# Patient Record
Sex: Female | Born: 1970 | Race: White | Hispanic: No
Health system: Southern US, Community
[De-identification: ages and names within clinical notes are randomized; demographics above are authoritative.]

---

## 2019-06-18 ENCOUNTER — Ambulatory Visit: Payer: 59 | Attending: Internal Medicine

## 2019-06-18 DIAGNOSIS — Z23 Encounter for immunization: Secondary | ICD-10-CM

## 2019-06-18 NOTE — Progress Notes (Signed)
   Covid-19 Vaccination Clinic  Name:  Sarah Cummings    MRN: [id number] DOB: 1970/08/05  06/18/2019  [name] was observed post Covid-19 immunization for 15 minutes without incident. She was provided with Vaccine Information Sheet and instruction to access the V-Safe system.   [name] was instructed to call 911 with any severe reactions post vaccine: Marland Kitchen Difficulty breathing  . Swelling of face and throat  . A fast heartbeat  . A bad rash all over body  . Dizziness and weakness   Immunizations Administered    Name Date Dose VIS Date Route   Pfizer COVID-19 Vaccine 06/18/2019  4:29 PM 0.3 mL 03/18/2019 Intramuscular   Manufacturer: [place]   Lot: [id number]   [place]: [id number]

## 2019-07-12 ENCOUNTER — Ambulatory Visit: Payer: 59

## 2019-07-12 ENCOUNTER — Ambulatory Visit: Payer: 59 | Attending: Internal Medicine

## 2019-07-12 DIAGNOSIS — Z23 Encounter for immunization: Secondary | ICD-10-CM

## 2019-07-12 NOTE — Progress Notes (Signed)
   Covid-19 Vaccination Clinic  Name:  GERRE YOSHINO    MRN: [id number] DOB: 10-19-1970  07/12/2019  Ms. Ransom was observed post Covid-19 immunization for 15 minutes without incident. She was provided with Vaccine Information Sheet and instruction to access the V-Safe system.   Ms. Otteson was instructed to call 911 with any severe reactions post vaccine: Marland Kitchen Difficulty breathing  . Swelling of face and throat  . A fast heartbeat  . A bad rash all over body  . Dizziness and weakness   Immunizations Administered    Name Date Dose VIS Date Route   Pfizer COVID-19 Vaccine 07/12/2019  4:37 PM 0.3 mL 03/18/2019 Intramuscular   Manufacturer: [place]   Lot: [id number]   [place]: [id number]

## 2019-09-29 ENCOUNTER — Other Ambulatory Visit: Payer: Self-pay | Admitting: Family Medicine

## 2019-09-29 DIAGNOSIS — E041 Nontoxic single thyroid nodule: Secondary | ICD-10-CM

## 2019-10-12 ENCOUNTER — Ambulatory Visit
Admission: RE | Admit: 2019-10-12 | Discharge: 2019-10-12 | Disposition: A | Discharge status: Home / Self Care | Payer: Commercial Managed Care - PPO | Source: Ambulatory Visit | Attending: Family Medicine | Admitting: Family Medicine

## 2019-10-12 DIAGNOSIS — E041 Nontoxic single thyroid nodule: Secondary | ICD-10-CM

## 2019-11-17 ENCOUNTER — Other Ambulatory Visit: Payer: Self-pay | Admitting: Family Medicine

## 2019-11-17 DIAGNOSIS — Z1231 Encounter for screening mammogram for malignant neoplasm of breast: Secondary | ICD-10-CM

## 2019-11-21 ENCOUNTER — Other Ambulatory Visit: Payer: Self-pay | Admitting: Family Medicine

## 2019-11-21 DIAGNOSIS — E041 Nontoxic single thyroid nodule: Secondary | ICD-10-CM

## 2019-11-30 ENCOUNTER — Other Ambulatory Visit: Payer: Self-pay

## 2019-11-30 ENCOUNTER — Ambulatory Visit
Admission: RE | Admit: 2019-11-30 | Discharge: 2019-11-30 | Disposition: A | Discharge status: Home / Self Care | Payer: Commercial Managed Care - PPO | Source: Ambulatory Visit | Attending: Family Medicine | Admitting: Family Medicine

## 2019-11-30 DIAGNOSIS — Z1231 Encounter for screening mammogram for malignant neoplasm of breast: Secondary | ICD-10-CM

## 2019-12-06 ENCOUNTER — Other Ambulatory Visit (HOSPITAL_COMMUNITY)
Admission: RE | Admit: 2019-12-06 | Discharge: 2019-12-06 | Disposition: A | Discharge status: Home / Self Care | Payer: Commercial Managed Care - PPO | Source: Ambulatory Visit | Attending: Radiology | Admitting: Radiology

## 2019-12-06 ENCOUNTER — Ambulatory Visit
Admission: RE | Admit: 2019-12-06 | Discharge: 2019-12-06 | Disposition: A | Discharge status: Home / Self Care | Payer: Commercial Managed Care - PPO | Source: Ambulatory Visit | Attending: Family Medicine | Admitting: Family Medicine

## 2019-12-06 DIAGNOSIS — E041 Nontoxic single thyroid nodule: Secondary | ICD-10-CM

## 2019-12-06 DIAGNOSIS — D440 Neoplasm of uncertain behavior of thyroid gland: Secondary | ICD-10-CM | POA: Insufficient documentation

## 2019-12-07 LAB — CYTOLOGY - NON PAP

## 2019-12-22 ENCOUNTER — Encounter (HOSPITAL_COMMUNITY): Payer: Self-pay

## 2021-01-25 ENCOUNTER — Other Ambulatory Visit: Payer: Self-pay | Admitting: Family Medicine

## 2021-01-25 DIAGNOSIS — Z1231 Encounter for screening mammogram for malignant neoplasm of breast: Secondary | ICD-10-CM

## 2021-02-25 ENCOUNTER — Other Ambulatory Visit: Payer: Self-pay

## 2021-02-25 ENCOUNTER — Ambulatory Visit
Admission: RE | Admit: 2021-02-25 | Discharge: 2021-02-25 | Disposition: A | Discharge status: Home / Self Care | Payer: Commercial Managed Care - PPO | Source: Ambulatory Visit | Attending: Family Medicine | Admitting: Family Medicine

## 2021-02-25 DIAGNOSIS — Z1231 Encounter for screening mammogram for malignant neoplasm of breast: Secondary | ICD-10-CM

## 2021-10-23 ENCOUNTER — Other Ambulatory Visit: Payer: Self-pay | Admitting: Family Medicine

## 2021-10-23 DIAGNOSIS — E041 Nontoxic single thyroid nodule: Secondary | ICD-10-CM

## 2021-10-30 ENCOUNTER — Ambulatory Visit
Admission: RE | Admit: 2021-10-30 | Discharge: 2021-10-30 | Disposition: A | Discharge status: Home / Self Care | Payer: Commercial Managed Care - PPO | Source: Ambulatory Visit | Attending: Family Medicine | Admitting: Family Medicine

## 2021-10-30 DIAGNOSIS — E041 Nontoxic single thyroid nodule: Secondary | ICD-10-CM

## 2022-04-09 ENCOUNTER — Other Ambulatory Visit: Payer: Self-pay | Admitting: Family Medicine

## 2022-04-09 DIAGNOSIS — Z1231 Encounter for screening mammogram for malignant neoplasm of breast: Secondary | ICD-10-CM

## 2022-05-31 IMAGING — MG MM DIGITAL SCREENING BILAT W/ TOMO AND CAD
8 series · 9 of 24 positions shown · non-contrast
Comparison: Previous exam(s).

CLINICAL DATA: Screening.

EXAM:
DIGITAL SCREENING BILATERAL MAMMOGRAM WITH TOMOSYNTHESIS AND CAD
TECHNIQUE: Bilateral screening digital craniocaudal and mediolateral oblique
mammograms were obtained. Bilateral screening digital breast
tomosynthesis was performed. The images were evaluated with
computer-aided detection.

[R MLO synth-2D]
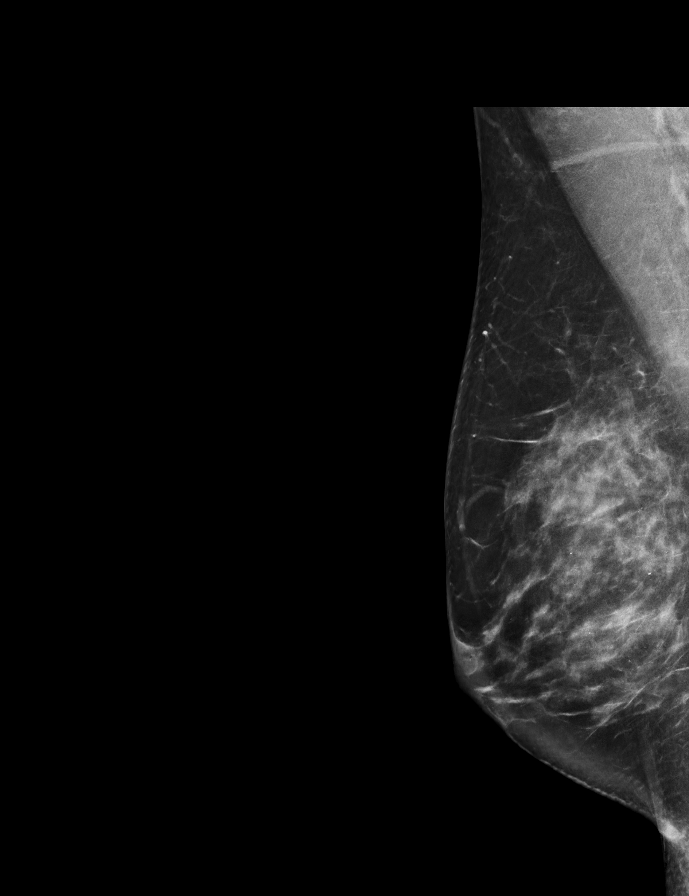

[L CC synth-2D]
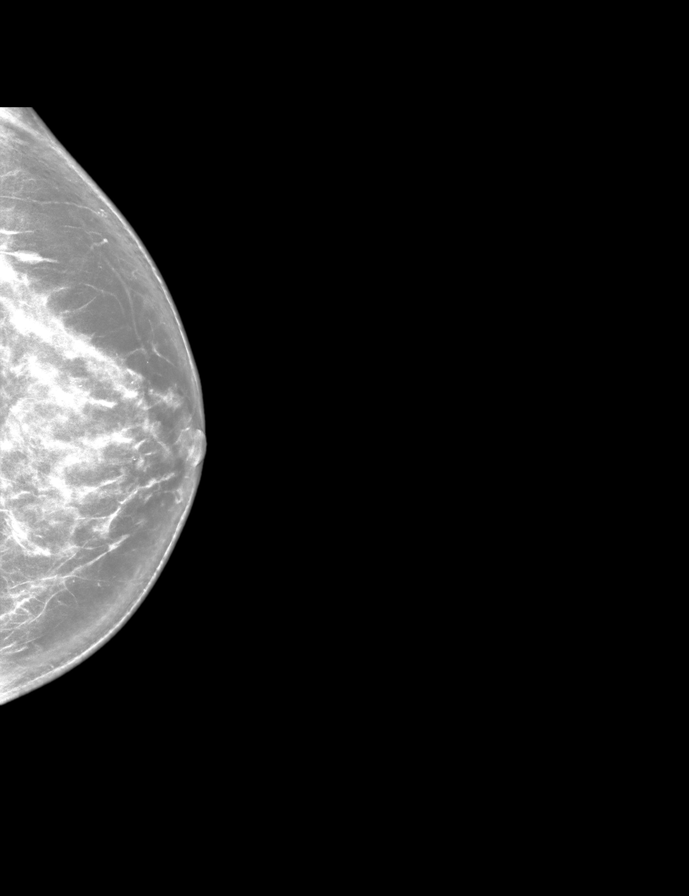

[R CC synth-2D]
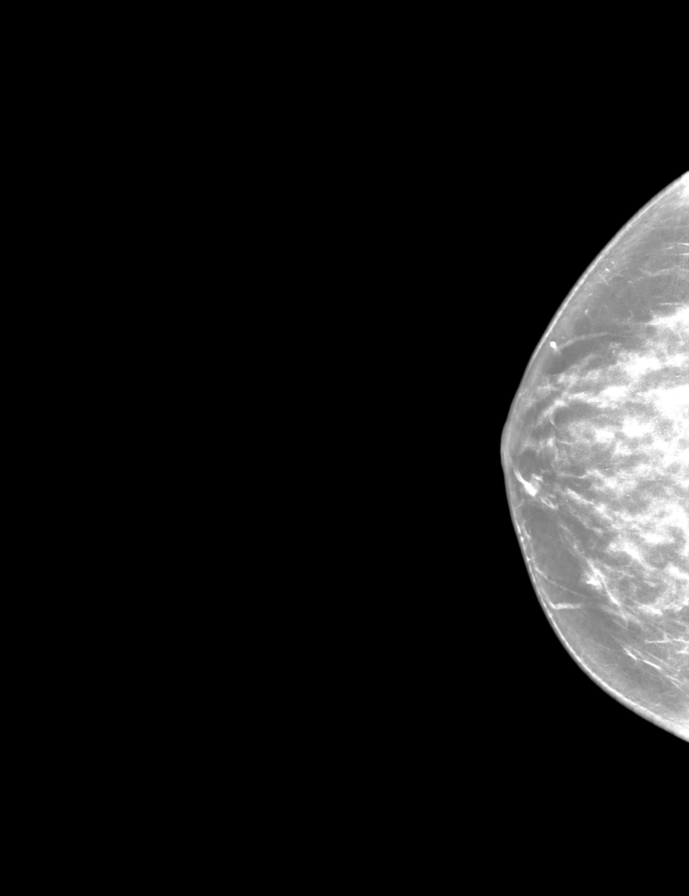

[L MLO synth-2D]
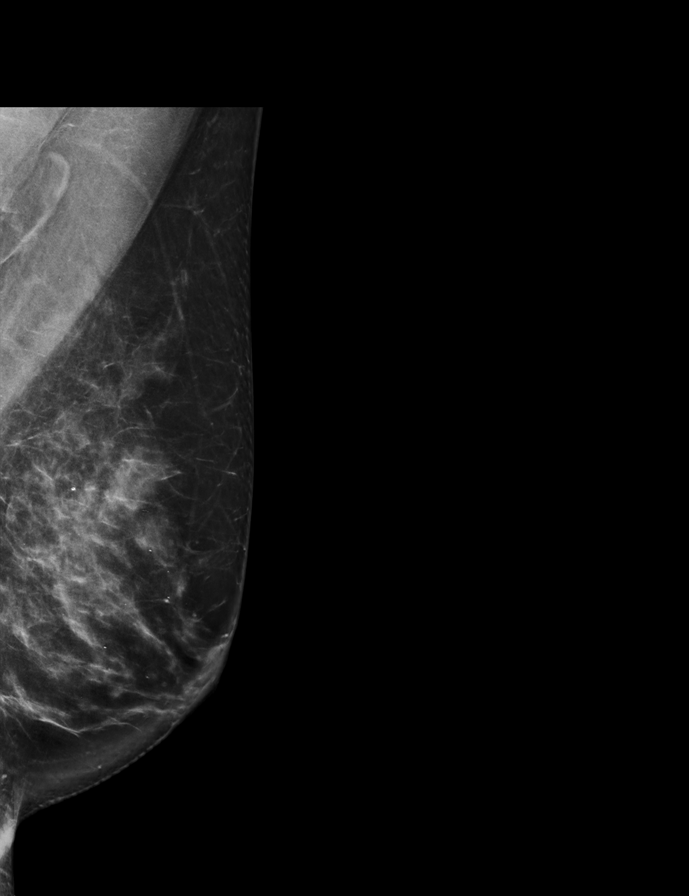

[R CC tomo · 2 of 66 frames shown]
[frame 22/66]
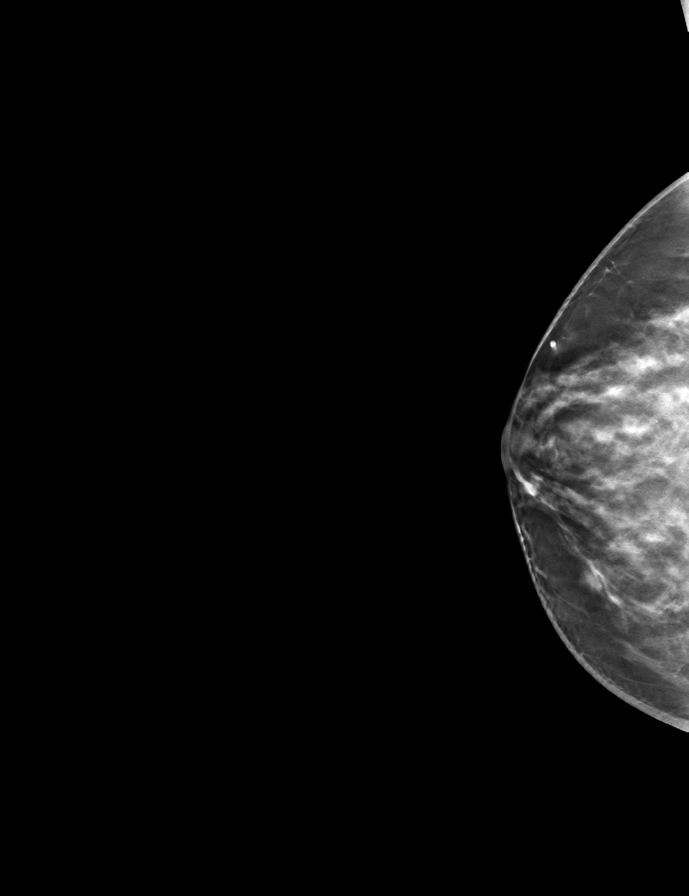
[frame 33/66]
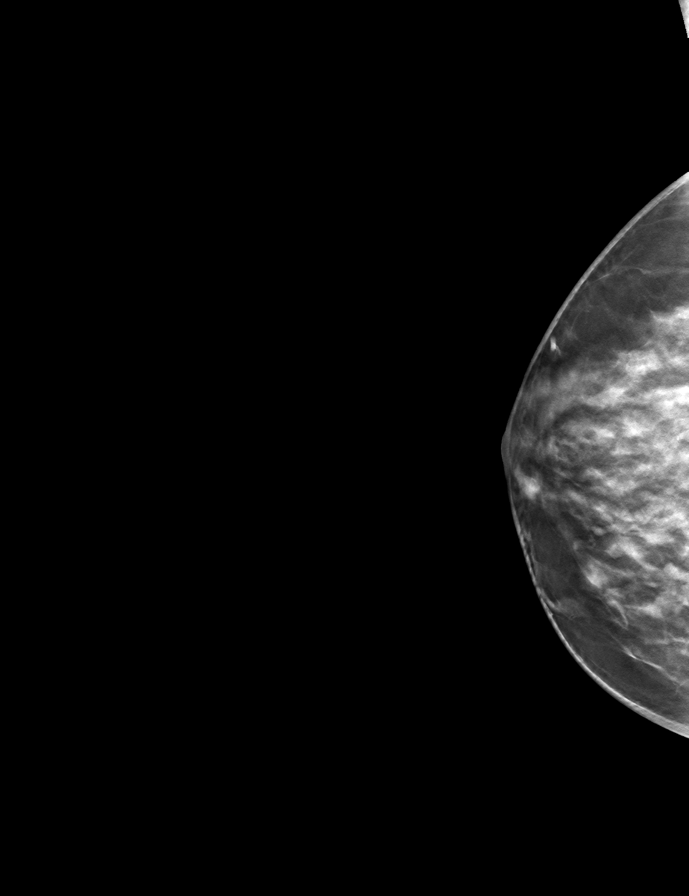

[L MLO tomo · tomo slice 39/78.0]
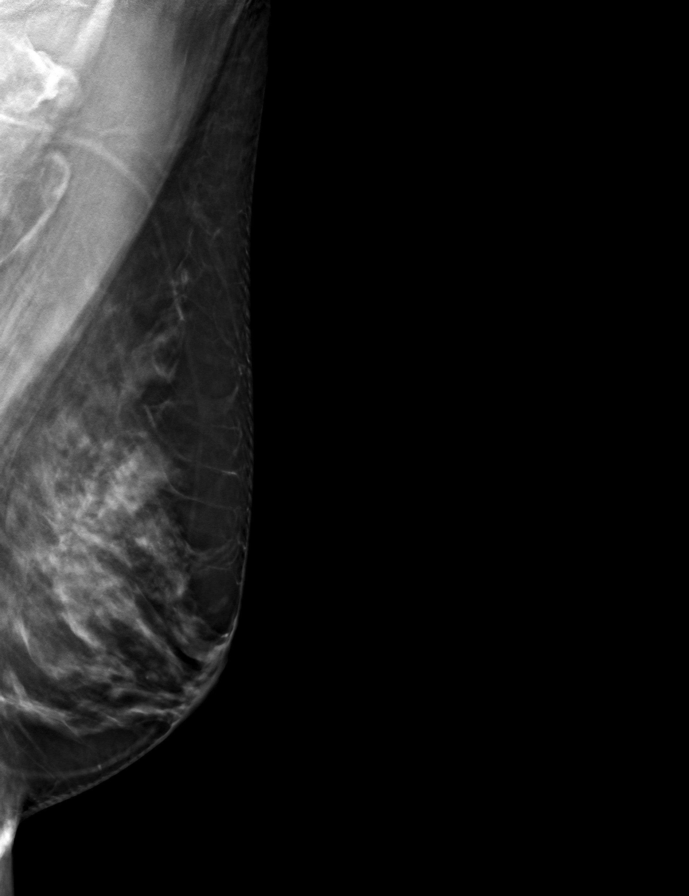

[R MLO tomo · tomo slice 39/77.0]
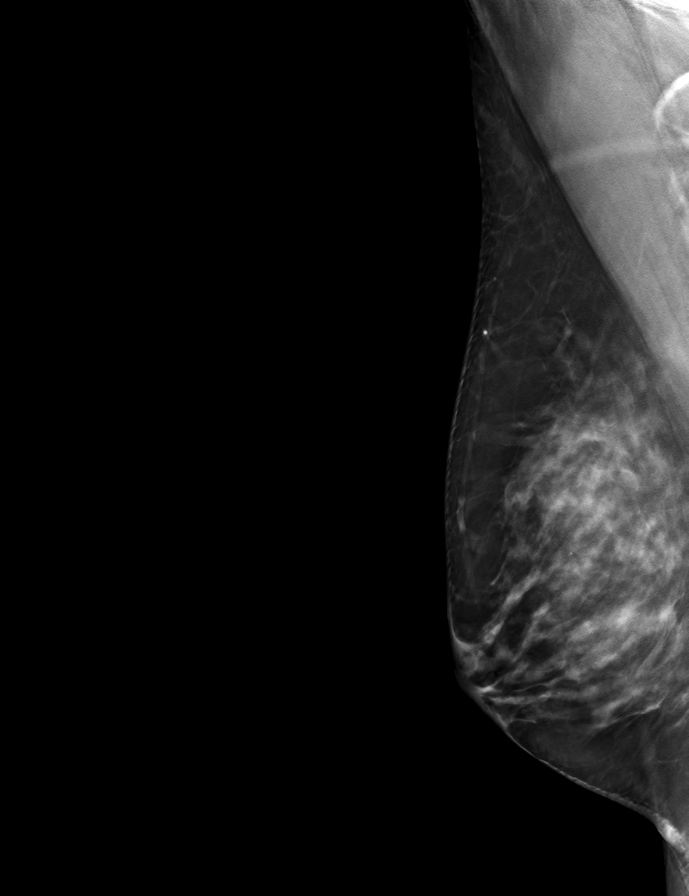

[L CC tomo · tomo slice 37/73.0]
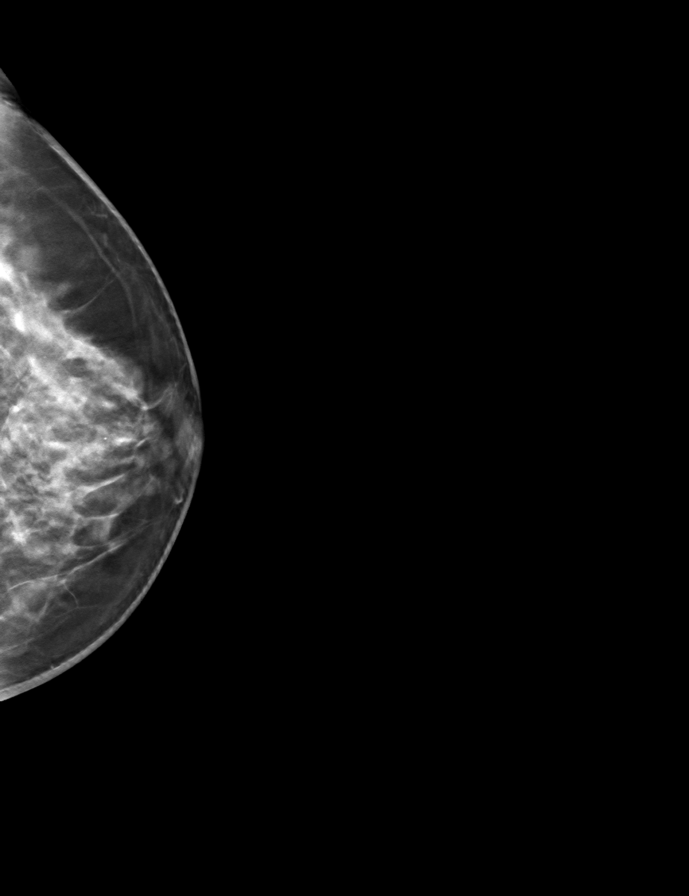

[9 of 24 positions shown; findings below may reference images not displayed]

ACR Breast Density Category d: The breast tissue is extremely dense,
which lowers the sensitivity of mammography
FINDINGS: There are no findings suspicious for malignancy.
IMPRESSION: No mammographic evidence of malignancy. A result letter of this
screening mammogram will be mailed directly to the patient.

RECOMMENDATION:
Screening mammogram in one year. (Code:[id number])

BI-RADS CATEGORY  1: Negative.

## 2022-06-02 ENCOUNTER — Ambulatory Visit
Admission: RE | Admit: 2022-06-02 | Discharge: 2022-06-02 | Disposition: A | Discharge status: Home / Self Care | Payer: Commercial Managed Care - PPO | Source: Ambulatory Visit | Attending: Family Medicine | Admitting: Family Medicine

## 2022-06-02 DIAGNOSIS — Z1231 Encounter for screening mammogram for malignant neoplasm of breast: Secondary | ICD-10-CM

## 2022-10-31 ENCOUNTER — Other Ambulatory Visit: Payer: Self-pay | Admitting: Family Medicine

## 2022-10-31 DIAGNOSIS — E041 Nontoxic single thyroid nodule: Secondary | ICD-10-CM

## 2022-11-11 ENCOUNTER — Other Ambulatory Visit: Payer: Commercial Managed Care - PPO

## 2022-11-14 ENCOUNTER — Other Ambulatory Visit: Payer: Commercial Managed Care - PPO

## 2022-11-21 ENCOUNTER — Other Ambulatory Visit: Payer: Commercial Managed Care - PPO

## 2022-11-28 ENCOUNTER — Ambulatory Visit: Admission: RE | Admit: 2022-11-28 | Payer: Commercial Managed Care - PPO | Source: Ambulatory Visit

## 2022-11-28 DIAGNOSIS — E041 Nontoxic single thyroid nodule: Secondary | ICD-10-CM

## 2023-05-11 ENCOUNTER — Other Ambulatory Visit: Payer: Self-pay | Admitting: Family Medicine

## 2023-05-11 DIAGNOSIS — Z1231 Encounter for screening mammogram for malignant neoplasm of breast: Secondary | ICD-10-CM

## 2023-06-11 ENCOUNTER — Ambulatory Visit: Payer: Commercial Managed Care - PPO

## 2023-06-15 ENCOUNTER — Ambulatory Visit
Admission: RE | Admit: 2023-06-15 | Discharge: 2023-06-15 | Disposition: A | Discharge status: Home / Self Care | Source: Ambulatory Visit | Attending: Family Medicine | Admitting: Family Medicine

## 2023-06-15 DIAGNOSIS — Z1231 Encounter for screening mammogram for malignant neoplasm of breast: Secondary | ICD-10-CM

## 2023-10-16 ENCOUNTER — Other Ambulatory Visit (HOSPITAL_BASED_OUTPATIENT_CLINIC_OR_DEPARTMENT_OTHER): Payer: Self-pay | Admitting: Family Medicine

## 2023-10-16 DIAGNOSIS — E781 Pure hyperglyceridemia: Secondary | ICD-10-CM

## 2023-11-05 ENCOUNTER — Ambulatory Visit (HOSPITAL_COMMUNITY)
Admission: RE | Admit: 2023-11-05 | Discharge: 2023-11-05 | Disposition: A | Discharge status: Home / Self Care | Payer: Self-pay | Source: Ambulatory Visit | Attending: Family Medicine | Admitting: Family Medicine

## 2023-11-05 DIAGNOSIS — E781 Pure hyperglyceridemia: Secondary | ICD-10-CM | POA: Insufficient documentation
